# Patient Record
Sex: Male | Born: 1970 | Race: White | Hispanic: No | Marital: Married | Smoking: Former smoker
Health system: Southern US, Community
[De-identification: ages and names within clinical notes are randomized; demographics above are authoritative.]

## PROBLEM LIST (undated history)

## (undated) DIAGNOSIS — K219 Gastro-esophageal reflux disease without esophagitis: Secondary | ICD-10-CM

## (undated) DIAGNOSIS — Z973 Presence of spectacles and contact lenses: Secondary | ICD-10-CM

## (undated) DIAGNOSIS — Z87442 Personal history of urinary calculi: Secondary | ICD-10-CM

## (undated) DIAGNOSIS — Z8489 Family history of other specified conditions: Secondary | ICD-10-CM

## (undated) DIAGNOSIS — K8020 Calculus of gallbladder without cholecystitis without obstruction: Secondary | ICD-10-CM

## (undated) HISTORY — PX: TYMPANOSTOMY TUBE PLACEMENT: SHX32

## (undated) HISTORY — PX: MOUTH SURGERY: SHX715

## (undated) HISTORY — PX: OTHER SURGICAL HISTORY: SHX169

## (undated) HISTORY — PX: TONSILLECTOMY: SUR1361

---

## 2000-05-04 ENCOUNTER — Other Ambulatory Visit: Admission: RE | Admit: 2000-05-04 | Discharge: 2000-05-04 | Payer: Self-pay | Admitting: Family Medicine

## 2015-08-25 ENCOUNTER — Other Ambulatory Visit: Payer: Self-pay | Admitting: Gastroenterology

## 2015-08-25 DIAGNOSIS — R1013 Epigastric pain: Secondary | ICD-10-CM

## 2015-09-16 ENCOUNTER — Ambulatory Visit
Admission: RE | Admit: 2015-09-16 | Discharge: 2015-09-16 | Disposition: A | Discharge status: Home / Self Care | Payer: Self-pay | Source: Ambulatory Visit | Attending: Gastroenterology | Admitting: Gastroenterology

## 2015-09-16 DIAGNOSIS — R1013 Epigastric pain: Secondary | ICD-10-CM

## 2015-09-22 ENCOUNTER — Other Ambulatory Visit: Payer: Self-pay

## 2015-10-06 ENCOUNTER — Ambulatory Visit: Payer: Self-pay | Admitting: General Surgery

## 2015-10-06 NOTE — H&P (Signed)
Gene Alvarez 09/30/2015 9:49 AM Location: [facility] Patient #: [id number] DOB: October 01, 1970 Married / Language: English / Race: White Male   History of Present Illness Minerva Areola M. Toribio Seiber MD; 09/30/2015 11:06 AM) The patient is a 45 year old male who presents with symptomatic choledocholithiasis.  Additional reasons for visit:  Gallbladder disease is described as the following: He is referred by Dr. Dulce Sellar for ongoing issues with epigastric pain and gallstones. He states he has had issues for several years. Initially he had more GERD-like symptoms with burning in his chest generally postprandial. He states that he lost weight and did multiple different PPIs and got improvement. However he had return of his symptoms a while ago and followed up with Dr. Dulce Sellar. His symptoms at that time were still suggestive of gastritis or GERD. He underwent an upper endoscopy last week which was essentially unremarkable. He had some very mild gastritis. He describes his episodes is generally occurring at night they last 4-5 hours. The discomfort is in his upper central abdomen underneath his xiphoid. It generally occurs 30 minutes to an hour after eating. It does not radiate to his back. He does not have vomiting with that he may have some loading sensation or a full sensation. There is simply a correlation with fatty increased feeds. But he is now even having symptoms with a fat-free, high protein high vegetable diet. He denies any acholic stools. He denies any significant NSAID use until recently to manage the epigastric discomfort. He had an ultrasound which demonstrated cholelithiasis without any evidence of common bile duct stones   Problem List/Past Medical Atilano Ina, MD; 09/30/2015 11:06 AM) SYMPTOMATIC CHOLELITHIASIS (K80.20)  Other Problems Atilano Ina, MD; 09/30/2015 11:06 AM) Back Pain Cholelithiasis Gastroesophageal Reflux Disease Kidney Stone  Past Surgical History Fay Records, CMA; 09/30/2015 9:50 AM) Oral Surgery Tonsillectomy Vasectomy  Diagnostic Studies History Fay Records, CMA; 09/30/2015 9:50 AM) Colonoscopy never  Allergies Fay Records, CMA; 09/30/2015 9:50 AM) No Known Drug Allergies01/07/2016  Medication History Fay Records, CMA; 09/30/2015 9:50 AM) No Current Medications Medications Reconciled  Social History Fay Records, CMA; 09/30/2015 9:50 AM) Alcohol use Moderate alcohol use. Caffeine use Coffee, Tea. No drug use Tobacco use Former smoker.  Family History Fay Records, [place]; 09/30/2015 9:50 AM) Arthritis Mother. Depression Mother. Diabetes Mellitus Father. Heart disease in male family member before age 72 Melanoma Father. Thyroid problems Mother.    Review of Systems Fay Records CMA; 09/30/2015 9:50 AM) General Not Present- Appetite Loss, Chills, Fatigue, Fever, Night Sweats, Weight Gain and Weight Loss. Skin Not Present- Change in Wart/Mole, Dryness, Hives, Jaundice, New Lesions, Non-Healing Wounds, Rash and Ulcer. HEENT Not Present- Earache, Hearing Loss, Hoarseness, Nose Bleed, Oral Ulcers, Ringing in the Ears, Seasonal Allergies, Sinus Pain, Sore Throat, Visual Disturbances, Wears glasses/contact lenses and Yellow Eyes. Respiratory Not Present- Bloody sputum, Chronic Cough, Difficulty Breathing, Snoring and Wheezing. Breast Not Present- Breast Mass, Breast Pain, Nipple Discharge and Skin Changes. Cardiovascular Not Present- Chest Pain, Difficulty Breathing Lying Down, Leg Cramps, Palpitations, Rapid Heart Rate, Shortness of Breath and Swelling of Extremities. Gastrointestinal Present- Abdominal Pain. Not Present- Bloating, Bloody Stool, Change in Bowel Habits, Chronic diarrhea, Constipation, Difficulty Swallowing, Excessive gas, Gets full quickly at meals, Hemorrhoids, Indigestion, Nausea, Rectal Pain and Vomiting. Male Genitourinary Not Present- Blood in Urine, Change in Urinary Stream, Frequency, Impotence,  Nocturia, Painful Urination, Urgency and Urine Leakage. Musculoskeletal Present- Joint Pain. Not Present- Back Pain, Joint Stiffness, Muscle Pain, Muscle Weakness and  Swelling of Extremities. Neurological Not Present- Decreased Memory, Fainting, Headaches, Numbness, Seizures, Tingling, Tremor, Trouble walking and Weakness. Psychiatric Not Present- Anxiety, Bipolar, Change in Sleep Pattern, Depression, Fearful and Frequent crying. Endocrine Not Present- Cold Intolerance, Excessive Hunger, Hair Changes, Heat Intolerance, Hot flashes and New Diabetes. Hematology Not Present- Easy Bruising, Excessive bleeding, Gland problems, HIV and Persistent Infections.  Vitals Fay Records CMA; 09/30/2015 9:50 AM) 09/30/2015 9:50 AM Weight: 262.6 lb Height: 70in Body Surface Area: 2.34 m Body Mass Index: 37.68 kg/m  Temp.: 98.52F(Temporal)  Pulse: 86 (Regular)  BP: 132/68 (Sitting, Left Arm, Standard)       Physical Exam Minerva Areola M. Yago Ludvigsen MD; 09/30/2015 10:41 AM) General Mental Status-Alert. General Appearance-Consistent with stated age. Hydration-Well hydrated. Voice-Normal. Note: obese   Head and Neck Head-normocephalic, atraumatic with no lesions or palpable masses. Trachea-midline. Thyroid Gland Characteristics - normal size and consistency.  Eye Eyeball - Bilateral-Extraocular movements intact. Sclera/Conjunctiva - Bilateral-No scleral icterus.  Chest and Lung Exam Chest and lung exam reveals -quiet, even and easy respiratory effort with no use of accessory muscles and on auscultation, normal breath sounds, no adventitious sounds and normal vocal resonance. Inspection Chest Wall - Normal. Back - normal.  Breast - Did not examine.  Cardiovascular Cardiovascular examination reveals -normal heart sounds, regular rate and rhythm with no murmurs and normal pedal pulses bilaterally.  Abdomen Inspection Inspection of the abdomen reveals - No Hernias. Skin  - Scar - no surgical scars. Palpation/Percussion Palpation and Percussion of the abdomen reveal - Soft, Non Tender, No Rebound tenderness, No Rigidity (guarding) and No hepatosplenomegaly. Auscultation Auscultation of the abdomen reveals - Bowel sounds normal.  Peripheral Vascular Upper Extremity Palpation - Pulses bilaterally normal.  Neurologic Neurologic evaluation reveals -alert and oriented x 3 with no impairment of recent or remote memory. Mental Status-Normal.  Neuropsychiatric The patient's mood and affect are described as -normal. Judgment and Insight-insight is appropriate concerning matters relevant to self.  Musculoskeletal Normal Exam - Left-Upper Extremity Strength Normal and Lower Extremity Strength Normal. Normal Exam - Right-Upper Extremity Strength Normal and Lower Extremity Strength Normal.  Lymphatic Head & Neck  General Head & Neck Lymphatics: Bilateral - Description - Normal. Axillary - Did not examine. Femoral & Inguinal - Did not examine.    Assessment & Plan Minerva Areola M. Josuha Fontanez MD; 09/30/2015 10:41 AM) SYMPTOMATIC CHOLELITHIASIS (K80.20) Impression: I believe the patient's symptoms are consistent with gallbladder disease.  We discussed gallbladder disease. The patient was given Agricultural engineer. We discussed non-operative and operative management. We discussed the signs & symptoms of acute cholecystitis  I discussed laparoscopic cholecystectomy with IOC in detail. The patient was given educational material as well as diagrams detailing the procedure. We discussed the risks and benefits of a laparoscopic cholecystectomy including, but not limited to bleeding, infection, injury to surrounding structures such as the intestine or liver, bile leak, retained gallstones, need to convert to an open procedure, prolonged diarrhea, blood clots such as DVT, common bile duct injury, anesthesia risks, and possible need for additional procedures. We discussed  the typical post-operative recovery course. I explained that the likelihood of improvement of their symptoms is good.  The patient has elected to proceed with surgery. Current Plans You are being scheduled for surgery - Our schedulers will call you.  You should hear from our office's scheduling department within 5 working days about the location, date, and time of surgery. We try to make accommodations for patient's preferences in scheduling surgery, but sometimes the OR schedule or  the surgeon's schedule prevents [place] from making those accommodations.  If you have not heard from our office [phone]) in 5 working days, call the office and ask for your surgeon's nurse.  If you have other questions about your diagnosis, plan, or surgery, call the office and ask for your surgeon's nurse.  Pt Education - Pamphlet Given - Laparoscopic Gallbladder Surgery: discussed with patient and provided information. Pt Education - CCS Laparosopic Post Op HCI ([place]ross)  [name]. Andrey Campanile, MD, FACS General, Bariatric, & Minimally Invasive Surgery [facility], [place]

## 2015-10-28 ENCOUNTER — Encounter (HOSPITAL_COMMUNITY)
Admission: RE | Admit: 2015-10-28 | Discharge: 2015-10-28 | Disposition: A | Discharge status: Home / Self Care | Payer: BLUE CROSS/BLUE SHIELD | Source: Ambulatory Visit | Attending: General Surgery | Admitting: General Surgery

## 2015-10-28 ENCOUNTER — Encounter (HOSPITAL_COMMUNITY): Payer: Self-pay

## 2015-10-28 DIAGNOSIS — R1013 Epigastric pain: Secondary | ICD-10-CM | POA: Diagnosis present

## 2015-10-28 DIAGNOSIS — K219 Gastro-esophageal reflux disease without esophagitis: Secondary | ICD-10-CM | POA: Diagnosis not present

## 2015-10-28 DIAGNOSIS — Z87891 Personal history of nicotine dependence: Secondary | ICD-10-CM | POA: Diagnosis not present

## 2015-10-28 DIAGNOSIS — K8010 Calculus of gallbladder with chronic cholecystitis without obstruction: Secondary | ICD-10-CM | POA: Diagnosis not present

## 2015-10-28 DIAGNOSIS — Z79899 Other long term (current) drug therapy: Secondary | ICD-10-CM | POA: Diagnosis not present

## 2015-10-28 HISTORY — DX: Presence of spectacles and contact lenses: Z97.3

## 2015-10-28 HISTORY — DX: Gastro-esophageal reflux disease without esophagitis: K21.9

## 2015-10-28 HISTORY — DX: Personal history of urinary calculi: Z87.442

## 2015-10-28 HISTORY — DX: Calculus of gallbladder without cholecystitis without obstruction: K80.20

## 2015-10-28 HISTORY — DX: Family history of other specified conditions: Z84.89

## 2015-10-28 LAB — CBC WITH DIFFERENTIAL/PLATELET
BASOS PCT: 0 %
Basophils Absolute: 0 10*3/uL (ref 0.0–0.1)
Eosinophils Absolute: 0.1 10*3/uL (ref 0.0–0.7)
Eosinophils Relative: 2 %
HEMATOCRIT: 47.5 % (ref 39.0–52.0)
HEMOGLOBIN: 15.7 g/dL (ref 13.0–17.0)
Lymphocytes Relative: 27 %
Lymphs Abs: 1.4 10*3/uL (ref 0.7–4.0)
MCH: 30.4 pg (ref 26.0–34.0)
MCHC: 33.1 g/dL (ref 30.0–36.0)
MCV: 92.1 fL (ref 78.0–100.0)
MONOS PCT: 5 %
Monocytes Absolute: 0.3 10*3/uL (ref 0.1–1.0)
NEUTROS ABS: 3.5 10*3/uL (ref 1.7–7.7)
NEUTROS PCT: 66 %
Platelets: 306 10*3/uL (ref 150–400)
RBC: 5.16 MIL/uL (ref 4.22–5.81)
RDW: 12.6 % (ref 11.5–15.5)
WBC: 5.3 10*3/uL (ref 4.0–10.5)

## 2015-10-28 LAB — COMPREHENSIVE METABOLIC PANEL
ALBUMIN: 4.7 g/dL (ref 3.5–5.0)
ALK PHOS: 69 U/L (ref 38–126)
ALT: 58 U/L (ref 17–63)
ANION GAP: 8 (ref 5–15)
AST: 43 U/L — AB (ref 15–41)
BILIRUBIN TOTAL: 0.7 mg/dL (ref 0.3–1.2)
BUN: 17 mg/dL (ref 6–20)
CALCIUM: 9.5 mg/dL (ref 8.9–10.3)
CO2: 27 mmol/L (ref 22–32)
CREATININE: 0.87 mg/dL (ref 0.61–1.24)
Chloride: 106 mmol/L (ref 101–111)
GFR calc Af Amer: >60 mL/min (ref >60)
GFR calc non Af Amer: >60 mL/min (ref >60)
GLUCOSE: 88 mg/dL (ref 65–99)
Potassium: 4.7 mmol/L (ref 3.5–5.1)
Sodium: 141 mmol/L (ref 135–145)
TOTAL PROTEIN: 7.5 g/dL (ref 6.5–8.1)

## 2015-10-28 NOTE — Progress Notes (Signed)
Pt aware of avoiding ibuprofen and naproxen prior to surgery

## 2015-10-28 NOTE — Patient Instructions (Signed)
Gene Alvarez  10/28/2015   Your procedure is scheduled on: Friday October 30, 2015   Report to [facility] Main  Entrance take Gene Alvarez  elevators to 3rd floor to  Short Stay Center at 5:30 AM.  Call this number if you have problems the morning of surgery [phone]   Remember: ONLY 1 PERSON MAY GO WITH YOU TO SHORT STAY TO GET  READY MORNING OF YOUR SURGERY.  Do not eat food or drink liquids :After Midnight.     Take these medicines the morning of surgery with A SIP OF WATER: hydrocodone-acetaminophen if needed                                You may not have any metal on your body including hair pins and              piercings  Do not wear jewelry, lotions, powders or colognes, deodorant                        Men may shave face and neck.   Do not bring valuables to the hospital. [name] IS NOT             RESPONSIBLE   FOR VALUABLES.  Contacts, dentures or bridgework may not be worn into surgery.      Patients discharged the day of surgery will not be allowed to drive home.  Name and phone number of your driver:Gene Alvarez (wife)  _____________________________________________________________________             [facility] - Preparing for Surgery Before surgery, you can play an important role.  Because skin is not sterile, your skin needs to be as free of germs as possible.  You can reduce the number of germs on your skin by washing with CHG (chlorahexidine gluconate) soap before surgery.  CHG is an antiseptic cleaner which kills germs and bonds with the skin to continue killing germs even after washing. Please DO NOT use if you have an allergy to CHG or antibacterial soaps.  If your skin becomes reddened/irritated stop using the CHG and inform your nurse when you arrive at Short Stay. Do not shave (including legs and underarms) for at least 48 hours prior to the first CHG shower.  You may shave your face/neck. Please follow these instructions  carefully:  1.  Shower with CHG Soap the night before surgery and the  morning of Surgery.  2.  If you choose to wash your hair, wash your hair first as usual with your  normal  shampoo.  3.  After you shampoo, rinse your hair and body thoroughly to remove the  shampoo.                           4.  Use CHG as you would any other liquid soap.  You can apply chg directly  to the skin and wash                       Gently with a scrungie or clean washcloth.  5.  Apply the CHG Soap to your body ONLY FROM THE NECK DOWN.   Do not use on face/ open  Wound or open sores. Avoid contact with eyes, ears mouth and genitals (private parts).                       Wash face,  Genitals (private parts) with your normal soap.             6.  Wash thoroughly, paying special attention to the area where your surgery  will be performed.  7.  Thoroughly rinse your body with warm water from the neck down.  8.  DO NOT shower/wash with your normal soap after using and rinsing off  the CHG Soap.                9.  Pat yourself dry with a clean towel.            10.  Wear clean pajamas.            11.  Place clean sheets on your bed the night of your first shower and do not  sleep with pets. Day of Surgery : Do not apply any lotions/deodorants the morning of surgery.  Please wear clean clothes to the hospital/surgery center.  FAILURE TO FOLLOW THESE INSTRUCTIONS MAY RESULT IN THE CANCELLATION OF YOUR SURGERY PATIENT SIGNATURE_________________________________  NURSE SIGNATURE__________________________________  ________________________________________________________________________

## 2015-10-30 ENCOUNTER — Ambulatory Visit (HOSPITAL_COMMUNITY)
Admission: RE | Admit: 2015-10-30 | Discharge: 2015-10-30 | Disposition: A | Discharge status: Home / Self Care | Payer: BLUE CROSS/BLUE SHIELD | Source: Ambulatory Visit | Attending: General Surgery | Admitting: General Surgery

## 2015-10-30 ENCOUNTER — Encounter (HOSPITAL_COMMUNITY)
Admission: RE | Disposition: A | Discharge status: Home / Self Care | Payer: Self-pay | Source: Ambulatory Visit | Attending: General Surgery

## 2015-10-30 ENCOUNTER — Encounter (HOSPITAL_COMMUNITY): Payer: Self-pay | Admitting: *Deleted

## 2015-10-30 ENCOUNTER — Ambulatory Visit (HOSPITAL_COMMUNITY): Payer: BLUE CROSS/BLUE SHIELD

## 2015-10-30 ENCOUNTER — Ambulatory Visit (HOSPITAL_COMMUNITY): Payer: BLUE CROSS/BLUE SHIELD | Admitting: Certified Registered"

## 2015-10-30 DIAGNOSIS — Z87891 Personal history of nicotine dependence: Secondary | ICD-10-CM | POA: Insufficient documentation

## 2015-10-30 DIAGNOSIS — Z419 Encounter for procedure for purposes other than remedying health state, unspecified: Secondary | ICD-10-CM

## 2015-10-30 DIAGNOSIS — K219 Gastro-esophageal reflux disease without esophagitis: Secondary | ICD-10-CM | POA: Insufficient documentation

## 2015-10-30 DIAGNOSIS — K8010 Calculus of gallbladder with chronic cholecystitis without obstruction: Secondary | ICD-10-CM | POA: Insufficient documentation

## 2015-10-30 DIAGNOSIS — Z79899 Other long term (current) drug therapy: Secondary | ICD-10-CM | POA: Insufficient documentation

## 2015-10-30 HISTORY — PX: CHOLECYSTECTOMY: SHX55

## 2015-10-30 SURGERY — LAPAROSCOPIC CHOLECYSTECTOMY WITH INTRAOPERATIVE CHOLANGIOGRAM
Anesthesia: General

## 2015-10-30 MED ORDER — BUPIVACAINE-EPINEPHRINE (PF) 0.25% -1:200000 IJ SOLN
INTRAMUSCULAR | Status: AC
  Filled 2015-10-30: qty 30

## 2015-10-30 MED ORDER — HYDROMORPHONE HCL 1 MG/ML IJ SOLN
INTRAMUSCULAR | Status: AC
  Filled 2015-10-30: qty 1

## 2015-10-30 MED ORDER — LACTATED RINGERS IV SOLN
INTRAVENOUS | Status: DC | PRN
  Administered 2015-10-30 (×2): via INTRAVENOUS

## 2015-10-30 MED ORDER — DEXAMETHASONE SODIUM PHOSPHATE 10 MG/ML IJ SOLN
INTRAMUSCULAR | Status: AC
  Filled 2015-10-30: qty 1

## 2015-10-30 MED ORDER — CEFOTETAN DISODIUM-DEXTROSE 2-2.08 GM-% IV SOLR
INTRAVENOUS | Status: AC
  Filled 2015-10-30: qty 50

## 2015-10-30 MED ORDER — DEXAMETHASONE SODIUM PHOSPHATE 10 MG/ML IJ SOLN
INTRAMUSCULAR | Status: DC | PRN
  Administered 2015-10-30: 5 mg via INTRAVENOUS

## 2015-10-30 MED ORDER — FENTANYL CITRATE (PF) 100 MCG/2ML IJ SOLN: 25.0000 ug | INTRAMUSCULAR | Status: DC | PRN

## 2015-10-30 MED ORDER — SODIUM CHLORIDE 0.9% FLUSH: 3.0000 mL | INTRAVENOUS | Status: DC | PRN

## 2015-10-30 MED ORDER — FENTANYL CITRATE (PF) 250 MCG/5ML IJ SOLN
INTRAMUSCULAR | Status: AC
  Filled 2015-10-30: qty 5

## 2015-10-30 MED ORDER — FENTANYL CITRATE (PF) 100 MCG/2ML IJ SOLN
INTRAMUSCULAR | Status: AC
  Filled 2015-10-30: qty 2

## 2015-10-30 MED ORDER — CEFOTETAN DISODIUM-DEXTROSE 2-2.08 GM-% IV SOLR
2.0000 g | INTRAVENOUS | Status: AC
  Administered 2015-10-30: 2 g via INTRAVENOUS

## 2015-10-30 MED ORDER — CHLORHEXIDINE GLUCONATE 4 % EX LIQD
1.0000 | Freq: Once | CUTANEOUS | Status: DC
Start: 2015-10-30 — End: 2015-10-30

## 2015-10-30 MED ORDER — SODIUM CHLORIDE 0.9% FLUSH: 3.0000 mL | Freq: Two times a day (BID) | INTRAVENOUS | Status: DC

## 2015-10-30 MED ORDER — OXYCODONE HCL 5 MG PO TABS: 5.0000 mg | ORAL_TABLET | ORAL | Status: AC | PRN

## 2015-10-30 MED ORDER — ROCURONIUM BROMIDE 100 MG/10ML IV SOLN
INTRAVENOUS | Status: AC
  Filled 2015-10-30: qty 1

## 2015-10-30 MED ORDER — CHLORHEXIDINE GLUCONATE 4 % EX LIQD: 1.0000 "application " | Freq: Once | CUTANEOUS | Status: DC

## 2015-10-30 MED ORDER — BUPIVACAINE-EPINEPHRINE 0.25% -1:200000 IJ SOLN
INTRAMUSCULAR | Status: DC | PRN
  Administered 2015-10-30: 30 mL

## 2015-10-30 MED ORDER — IOHEXOL 300 MG/ML  SOLN
INTRAMUSCULAR | Status: DC | PRN
  Administered 2015-10-30: 50 mL via INTRAVENOUS

## 2015-10-30 MED ORDER — ONDANSETRON HCL 4 MG/2ML IJ SOLN
INTRAMUSCULAR | Status: DC | PRN
  Administered 2015-10-30 (×4): 2 mg via INTRAVENOUS

## 2015-10-30 MED ORDER — ACETAMINOPHEN 325 MG PO TABS: 650.0000 mg | ORAL_TABLET | ORAL | Status: DC | PRN

## 2015-10-30 MED ORDER — SODIUM CHLORIDE 0.9 % IV SOLN: INTRAVENOUS | Status: DC

## 2015-10-30 MED ORDER — ONDANSETRON HCL 4 MG/2ML IJ SOLN: 4.0000 mg | Freq: Once | INTRAMUSCULAR | Status: DC | PRN

## 2015-10-30 MED ORDER — PROPOFOL 10 MG/ML IV BOLUS
INTRAVENOUS | Status: DC | PRN
  Administered 2015-10-30: 200 mg via INTRAVENOUS
  Administered 2015-10-30 (×2): 50 mg via INTRAVENOUS

## 2015-10-30 MED ORDER — FENTANYL CITRATE (PF) 100 MCG/2ML IJ SOLN
INTRAMUSCULAR | Status: DC | PRN
  Administered 2015-10-30 (×5): 50 ug via INTRAVENOUS

## 2015-10-30 MED ORDER — SUGAMMADEX SODIUM 200 MG/2ML IV SOLN
INTRAVENOUS | Status: DC | PRN
  Administered 2015-10-30: 200 mg via INTRAVENOUS

## 2015-10-30 MED ORDER — ACETAMINOPHEN 500 MG PO TABS: 1000.0000 mg | ORAL_TABLET | Freq: Four times a day (QID) | ORAL | Status: DC

## 2015-10-30 MED ORDER — LIDOCAINE HCL (CARDIAC) 20 MG/ML IV SOLN
INTRAVENOUS | Status: DC | PRN
  Administered 2015-10-30: 100 mg via INTRAVENOUS

## 2015-10-30 MED ORDER — SUGAMMADEX SODIUM 200 MG/2ML IV SOLN
INTRAVENOUS | Status: AC
  Filled 2015-10-30: qty 2

## 2015-10-30 MED ORDER — PROPOFOL 10 MG/ML IV BOLUS
INTRAVENOUS | Status: AC
  Filled 2015-10-30: qty 20

## 2015-10-30 MED ORDER — ACETAMINOPHEN 650 MG RE SUPP: 650.0000 mg | RECTAL | Status: DC | PRN

## 2015-10-30 MED ORDER — OXYCODONE HCL 5 MG PO TABS
5.0000 mg | ORAL_TABLET | ORAL | Status: DC | PRN
  Administered 2015-10-30: 5 mg via ORAL
  Filled 2015-10-30: qty 1

## 2015-10-30 MED ORDER — MIDAZOLAM HCL 2 MG/2ML IJ SOLN
INTRAMUSCULAR | Status: AC
  Filled 2015-10-30: qty 2

## 2015-10-30 MED ORDER — SODIUM CHLORIDE 0.9 % IV SOLN: 250.0000 mL | INTRAVENOUS | Status: DC | PRN

## 2015-10-30 MED ORDER — ROCURONIUM BROMIDE 100 MG/10ML IV SOLN
INTRAVENOUS | Status: DC | PRN
  Administered 2015-10-30: 20 mg via INTRAVENOUS
  Administered 2015-10-30: 50 mg via INTRAVENOUS

## 2015-10-30 MED ORDER — LACTATED RINGERS IV SOLN: INTRAVENOUS | Status: DC

## 2015-10-30 MED ORDER — MIDAZOLAM HCL 5 MG/5ML IJ SOLN
INTRAMUSCULAR | Status: DC | PRN
  Administered 2015-10-30: 2 mg via INTRAVENOUS

## 2015-10-30 MED ORDER — LIDOCAINE HCL (CARDIAC) 20 MG/ML IV SOLN
INTRAVENOUS | Status: AC
  Filled 2015-10-30: qty 5

## 2015-10-30 MED ORDER — LACTATED RINGERS IV SOLN
INTRAVENOUS | Status: DC | PRN
  Administered 2015-10-30: 1000 mL via INTRAVENOUS

## 2015-10-30 MED ORDER — HYDROMORPHONE HCL 1 MG/ML IJ SOLN
0.2500 mg | INTRAMUSCULAR | Status: DC | PRN
  Administered 2015-10-30 (×4): 0.5 mg via INTRAVENOUS

## 2015-10-30 MED ORDER — MEPERIDINE HCL 50 MG/ML IJ SOLN: 6.2500 mg | INTRAMUSCULAR | Status: DC | PRN

## 2015-10-30 MED ORDER — ONDANSETRON HCL 4 MG/2ML IJ SOLN
INTRAMUSCULAR | Status: AC
  Filled 2015-10-30: qty 4

## 2015-10-30 SURGICAL SUPPLY — 48 items
ADH SKN CLS APL DERMABOND .7 (GAUZE/BANDAGES/DRESSINGS) ×2
APL SRG 38 LTWT LNG FL B (MISCELLANEOUS) ×1
APPLICATOR ARISTA FLEXITIP XL (MISCELLANEOUS) ×2 IMPLANT
APPLIER CLIP 5 13 M/L LIGAMAX5 (MISCELLANEOUS) ×2
APPLIER CLIP ROT 10 11.4 M/L (STAPLE)
APR CLP MED LRG 11.4X10 (STAPLE)
APR CLP MED LRG 5 ANG JAW (MISCELLANEOUS) ×1
BAG SPEC RTRVL 10 TROC 200 (ENDOMECHANICALS) ×1
CABLE HIGH FREQUENCY MONO STRZ (ELECTRODE) ×2 IMPLANT
CHLORAPREP W/TINT 26ML (MISCELLANEOUS) ×2 IMPLANT
CLIP APPLIE 5 13 M/L LIGAMAX5 (MISCELLANEOUS) ×1 IMPLANT
CLIP APPLIE ROT 10 11.4 M/L (STAPLE) IMPLANT
COVER MAYO STAND STRL (DRAPES) ×2 IMPLANT
COVER SURGICAL LIGHT HANDLE (MISCELLANEOUS) ×2 IMPLANT
DECANTER SPIKE VIAL GLASS SM (MISCELLANEOUS) ×2 IMPLANT
DERMABOND ADVANCED (GAUZE/BANDAGES/DRESSINGS) ×2
DERMABOND ADVANCED .7 DNX12 (GAUZE/BANDAGES/DRESSINGS) ×2 IMPLANT
DEVICE TROCAR PUNCTURE CLOSURE (ENDOMECHANICALS) ×2 IMPLANT
DRAPE C-ARM 42X120 X-RAY (DRAPES) ×2 IMPLANT
DRAPE LAPAROSCOPIC ABDOMINAL (DRAPES) ×2 IMPLANT
ELECT PENCIL ROCKER SW 15FT (MISCELLANEOUS) ×2 IMPLANT
ELECT REM PT RETURN 9FT ADLT (ELECTROSURGICAL) ×2
ELECTRODE REM PT RTRN 9FT ADLT (ELECTROSURGICAL) ×1 IMPLANT
ENDOLOOP SUT PDS II  0 18 (SUTURE) ×1
ENDOLOOP SUT PDS II 0 18 (SUTURE) ×1 IMPLANT
GLOVE BIOGEL M STRL SZ7.5 (GLOVE) ×2 IMPLANT
GOWN STRL REUS W/TWL XL LVL3 (GOWN DISPOSABLE) ×6 IMPLANT
HEMOSTAT ARISTA ABSORB 3G PWDR (MISCELLANEOUS) ×2 IMPLANT
HEMOSTAT SNOW SURGICEL 2X4 (HEMOSTASIS) IMPLANT
KIT BASIN OR (CUSTOM PROCEDURE TRAY) ×2 IMPLANT
L-HOOK LAP DISP 36CM (ELECTROSURGICAL) ×2
LHOOK LAP DISP 36CM (ELECTROSURGICAL) ×1 IMPLANT
LIQUID BAND (GAUZE/BANDAGES/DRESSINGS) ×2 IMPLANT
POUCH RETRIEVAL ECOSAC 10 (ENDOMECHANICALS) ×1 IMPLANT
POUCH RETRIEVAL ECOSAC 10MM (ENDOMECHANICALS) ×1
SCISSORS LAP 5X35 DISP (ENDOMECHANICALS) ×2 IMPLANT
SET CHOLANGIOGRAPH MIX (MISCELLANEOUS) ×2 IMPLANT
SET IRRIG TUBING LAPAROSCOPIC (IRRIGATION / IRRIGATOR) ×2 IMPLANT
SLEEVE SURGEON STRL (DRAPES) ×2 IMPLANT
SLEEVE XCEL OPT CAN 5 100 (ENDOMECHANICALS) ×4 IMPLANT
SUT MNCRL AB 4-0 PS2 18 (SUTURE) ×4 IMPLANT
SUT VICRYL 0 UR6 27IN ABS (SUTURE) ×2 IMPLANT
TOWEL OR 17X26 10 PK STRL BLUE (TOWEL DISPOSABLE) ×2 IMPLANT
TOWEL OR NON WOVEN STRL DISP B (DISPOSABLE) ×2 IMPLANT
TRAY LAPAROSCOPIC (CUSTOM PROCEDURE TRAY) ×2 IMPLANT
TROCAR BLADELESS OPT 5 100 (ENDOMECHANICALS) ×2 IMPLANT
TROCAR XCEL BLUNT TIP 100MML (ENDOMECHANICALS) ×2 IMPLANT
TROCAR XCEL NON-BLD 11X100MML (ENDOMECHANICALS) IMPLANT

## 2015-10-30 NOTE — H&P (Signed)
Gene Alvarez 09/30/2015 9:49 AM Location: [facility] Patient #: [id number] DOB: 06/02/1971 Married / Language: English / Race: White Male   History of Present Illness (Roise Emert M. Dinesha Twiggs MD; 09/30/2015 11:06 AM) The patient is a 45 year old male who presents with symptomatic choledocholithiasis.  Additional reasons for visit:  Gallbladder disease is described as the following: He is referred by Dr. outlaw for ongoing issues with epigastric pain and gallstones. He states he has had issues for several years. Initially he had more GERD-like symptoms with burning in his chest generally postprandial. He states that he lost weight and did multiple different PPIs and got improvement. However he had return of his symptoms a while ago and followed up with Dr. outlaw. His symptoms at that time were still suggestive of gastritis or GERD. He underwent an upper endoscopy last week which was essentially unremarkable. He had some very mild gastritis. He describes his episodes is generally occurring at night they last 4-5 hours. The discomfort is in his upper central abdomen underneath his xiphoid. It generally occurs 30 minutes to an hour after eating. It does not radiate to his back. He does not have vomiting with that he may have some loading sensation or a full sensation. There is simply a correlation with fatty increased feeds. But he is now even having symptoms with a fat-free, high protein high vegetable diet. He denies any acholic stools. He denies any significant NSAID use until recently to manage the epigastric discomfort. He had an ultrasound which demonstrated cholelithiasis without any evidence of common bile duct stones   Problem List/Past Medical (Sheena Donegan M Kaelei Wheeler, MD; 09/30/2015 11:06 AM) SYMPTOMATIC CHOLELITHIASIS (K80.20)  Other Problems (Jhordan Kinter M Thomas Rhude, MD; 09/30/2015 11:06 AM) Back Pain Cholelithiasis Gastroesophageal Reflux Disease Kidney Stone  Past Surgical History (Ashley  Beck, CMA; 09/30/2015 9:50 AM) Oral Surgery Tonsillectomy Vasectomy  Diagnostic Studies History (Ashley Beck, CMA; 09/30/2015 9:50 AM) Colonoscopy never  Allergies (Ashley Beck, CMA; 09/30/2015 9:50 AM) No Known Drug Allergies01/07/2016  Medication History (Ashley Beck, CMA; 09/30/2015 9:50 AM) No Current Medications Medications Reconciled  Social History (Ashley Beck, CMA; 09/30/2015 9:50 AM) Alcohol use Moderate alcohol use. Caffeine use Coffee, Tea. No drug use Tobacco use Former smoker.  Family History (Ashley Beck, CMA; 09/30/2015 9:50 AM) Arthritis Mother. Depression Mother. Diabetes Mellitus Father. Heart disease in male family member before age 65 Melanoma Father. Thyroid problems Mother.    Review of Systems (Ashley Beck CMA; 09/30/2015 9:50 AM) General Not Present- Appetite Loss, Chills, Fatigue, Fever, Night Sweats, Weight Gain and Weight Loss. Skin Not Present- Change in Wart/Mole, Dryness, Hives, Jaundice, New Lesions, Non-Healing Wounds, Rash and Ulcer. HEENT Not Present- Earache, Hearing Loss, Hoarseness, Nose Bleed, Oral Ulcers, Ringing in the Ears, Seasonal Allergies, Sinus Pain, Sore Throat, Visual Disturbances, Wears glasses/contact lenses and Yellow Eyes. Respiratory Not Present- Bloody sputum, Chronic Cough, Difficulty Breathing, Snoring and Wheezing. Breast Not Present- Breast Mass, Breast Pain, Nipple Discharge and Skin Changes. Cardiovascular Not Present- Chest Pain, Difficulty Breathing Lying Down, Leg Cramps, Palpitations, Rapid Heart Rate, Shortness of Breath and Swelling of Extremities. Gastrointestinal Present- Abdominal Pain. Not Present- Bloating, Bloody Stool, Change in Bowel Habits, Chronic diarrhea, Constipation, Difficulty Swallowing, Excessive gas, Gets full quickly at meals, Hemorrhoids, Indigestion, Nausea, Rectal Pain and Vomiting. Male Genitourinary Not Present- Blood in Urine, Change in Urinary Stream, Frequency, Impotence,  Nocturia, Painful Urination, Urgency and Urine Leakage. Musculoskeletal Present- Joint Pain. Not Present- Back Pain, Joint Stiffness, Muscle Pain, Muscle Weakness and   Swelling of Extremities. Neurological Not Present- Decreased Memory, Fainting, Headaches, Numbness, Seizures, Tingling, Tremor, Trouble walking and Weakness. Psychiatric Not Present- Anxiety, Bipolar, Change in Sleep Pattern, Depression, Fearful and Frequent crying. Endocrine Not Present- Cold Intolerance, Excessive Hunger, Hair Changes, Heat Intolerance, Hot flashes and New Diabetes. Hematology Not Present- Easy Bruising, Excessive bleeding, Gland problems, HIV and Persistent Infections.  Vitals (Ashley Beck CMA; 09/30/2015 9:50 AM) 09/30/2015 9:50 AM Weight: 262.6 lb Height: 70in Body Surface Area: 2.34 m Body Mass Index: 37.68 kg/m  Temp.: 98.1F(Temporal)  Pulse: 86 (Regular)  BP: 132/68 (Sitting, Left Arm, Standard)       Physical Exam (Aerika Groll M. Bron Snellings MD; 09/30/2015 10:41 AM) General Mental Status-Alert. General Appearance-Consistent with stated age. Hydration-Well hydrated. Voice-Normal. Note: obese   Head and Neck Head-normocephalic, atraumatic with no lesions or palpable masses. Trachea-midline. Thyroid Gland Characteristics - normal size and consistency.  Eye Eyeball - Bilateral-Extraocular movements intact. Sclera/Conjunctiva - Bilateral-No scleral icterus.  Chest and Lung Exam Chest and lung exam reveals -quiet, even and easy respiratory effort with no use of accessory muscles and on auscultation, normal breath sounds, no adventitious sounds and normal vocal resonance. Inspection Chest Wall - Normal. Back - normal.  Breast - Did not examine.  Cardiovascular Cardiovascular examination reveals -normal heart sounds, regular rate and rhythm with no murmurs and normal pedal pulses bilaterally.  Abdomen Inspection Inspection of the abdomen reveals - No Hernias. Skin  - Scar - no surgical scars. Palpation/Percussion Palpation and Percussion of the abdomen reveal - Soft, Non Tender, No Rebound tenderness, No Rigidity (guarding) and No hepatosplenomegaly. Auscultation Auscultation of the abdomen reveals - Bowel sounds normal.  Peripheral Vascular Upper Extremity Palpation - Pulses bilaterally normal.  Neurologic Neurologic evaluation reveals -alert and oriented x 3 with no impairment of recent or remote memory. Mental Status-Normal.  Neuropsychiatric The patient's mood and affect are described as -normal. Judgment and Insight-insight is appropriate concerning matters relevant to self.  Musculoskeletal Normal Exam - Left-Upper Extremity Strength Normal and Lower Extremity Strength Normal. Normal Exam - Right-Upper Extremity Strength Normal and Lower Extremity Strength Normal.  Lymphatic Head & Neck  General Head & Neck Lymphatics: Bilateral - Description - Normal. Axillary - Did not examine. Femoral & Inguinal - Did not examine.    Assessment & Plan (Lexys Milliner M. Hendryx Ricke MD; 09/30/2015 10:41 AM) SYMPTOMATIC CHOLELITHIASIS (K80.20) Impression: I believe the patient's symptoms are consistent with gallbladder disease.  We discussed gallbladder disease. The patient was given educational material. We discussed non-operative and operative management. We discussed the signs & symptoms of acute cholecystitis  I discussed laparoscopic cholecystectomy with IOC in detail. The patient was given educational material as well as diagrams detailing the procedure. We discussed the risks and benefits of a laparoscopic cholecystectomy including, but not limited to bleeding, infection, injury to surrounding structures such as the intestine or liver, bile leak, retained gallstones, need to convert to an open procedure, prolonged diarrhea, blood clots such as DVT, common bile duct injury, anesthesia risks, and possible need for additional procedures. We discussed  the typical post-operative recovery course. I explained that the likelihood of improvement of their symptoms is good.  The patient has elected to proceed with surgery. Current Plans You are being scheduled for surgery - Our schedulers will call you.  You should hear from our office's scheduling department within 5 working days about the location, date, and time of surgery. We try to make accommodations for patient's preferences in scheduling surgery, but sometimes the OR schedule or   the surgeon's schedule prevents us from making those accommodations.  If you have not heard from our office ([phone]) in 5 working days, call the office and ask for your surgeon's nurse.  If you have other questions about your diagnosis, plan, or surgery, call the office and ask for your surgeon's nurse.  Pt Education - Pamphlet Given - Laparoscopic Gallbladder Surgery: discussed with patient and provided information. Pt Education - CCS Laparosopic Post Op HCI ([place])  Gena Laski M. Brittlyn Cloe, MD, FACS General, Bariatric, & Minimally Invasive Surgery [facility] [place]  

## 2015-10-30 NOTE — Discharge Instructions (Signed)
[facility] °LAPAROSCOPIC SURGERY: POST OP INSTRUCTIONS °Always review your discharge instruction sheet given to you by the facility where your surgery was performed. °IF YOU HAVE DISABILITY OR FAMILY LEAVE FORMS, YOU MUST BRING THEM TO THE OFFICE FOR PROCESSING.   °DO NOT GIVE THEM TO YOUR DOCTOR. ° °1. A prescription for pain medication may be given to you upon discharge.  Take your pain medication as prescribed, if needed.  If narcotic pain medicine is not needed, then you may take acetaminophen (Tylenol) or ibuprofen (Advil) as needed. °2. Take your usually prescribed medications unless otherwise directed. °3. If you need a refill on your pain medication, please contact your pharmacy.  They will contact our office to request authorization. Prescriptions will not be filled after 5pm or on week-ends. °4. You should follow a light diet the first few days after arrival home, such as soup and crackers, etc.  Be sure to include lots of fluids daily. °5. Most patients will experience some swelling and bruising in the area of the incisions.  Ice packs will help.  Swelling and bruising can take several days to resolve.  °6. It is common to experience some constipation if taking pain medication after surgery.  Increasing fluid intake and taking a stool softener (such as Colace) will usually help or prevent this problem from occurring.  A mild laxative (Milk of Magnesia or Miralax) should be taken according to package instructions if there are no bowel movements after 48 hours. °7.  If your surgeon used skin glue on the incision, you may shower in 24 hours.  The glue will Ayad off over the next 2-3 weeks.  Any sutures or staples will be removed at the office during your follow-up visit. °8. ACTIVITIES:  You may resume regular (light) daily activities beginning the next day--such as daily self-care, walking, climbing stairs--gradually increasing activities as tolerated.  You may have sexual  intercourse when it is comfortable.  Refrain from any heavy lifting or straining until approved by your doctor. °a. You may drive when you are no longer taking prescription pain medication, you can comfortably wear a seatbelt, and you can safely maneuver your car and apply brakes. °9. You should see your doctor in the office for a follow-up appointment approximately 2-3 weeks after your surgery.  Make sure that you call for this appointment within a day or two after you arrive home to insure a convenient appointment time. °10. OTHER INSTRUCTIONS:DO NOT LIFT, PUSH, OR PULL ANYTHING GREATER THAN 10 POUNDS FOR 2 WEEKS  °WHEN TO CALL YOUR DOCTOR: °1. Fever over 101.0 °2. Inability to urinate °3. Continued bleeding from incision. °4. Increased pain, redness, or drainage from the incision. °5. Increasing abdominal pain ° °The clinic staff is available to answer your questions during regular business hours.  Please don’t hesitate to call and ask to speak to one of the nurses for clinical concerns.  If you have a medical emergency, go to the nearest emergency room or call 911.  A surgeon from [facility] is always on call at the hospital. °[address], [address], [place], [place]  [address] ? [address] [address], [place], [place]   [address] °[phone] ? [phone] ? FAX [phone] °Web site: [email or web address] ° °

## 2015-10-30 NOTE — Anesthesia Postprocedure Evaluation (Signed)
Anesthesia Post Note  Patient: Gene Alvarez  Procedure(s) Performed: Procedure(s) (LRB): LAPAROSCOPIC CHOLECYSTECTOMY WITH INTRAOPERATIVE CHOLANGIOGRAM (N/A)  Patient location during evaluation: PACU Anesthesia Type: General Level of consciousness: awake and alert Pain management: pain level controlled Vital Signs Assessment: post-procedure vital signs reviewed and stable Respiratory status: spontaneous breathing, nonlabored ventilation, respiratory function stable and patient connected to nasal cannula oxygen Cardiovascular status: blood pressure returned to baseline and stable Postop Assessment: no signs of nausea or vomiting Anesthetic complications: no    Last Vitals:  Filed Vitals:   10/30/15 0945 10/30/15 1007  BP: 143/87 112/76  Pulse: 83 79  Temp: 36.4 C 36.4 C  Resp: 14 16    Last Pain:  Filed Vitals:   10/30/15 1015  PainSc: 4                  Sibel Khurana DAVID

## 2015-10-30 NOTE — Interval H&P Note (Signed)
History and Physical Interval Note:  10/30/2015 7:19 AM  Gene Alvarez  has presented today for surgery, with the diagnosis of symptomatic cholelithiasis  The various methods of treatment have been discussed with the patient and family. After consideration of risks, benefits and other options for treatment, the patient has consented to  Procedure(s): LAPAROSCOPIC CHOLECYSTECTOMY WITH INTRAOPERATIVE CHOLANGIOGRAM (N/A) as a surgical intervention .  The patient's history has been reviewed, patient examined, no change in status, stable for surgery.  I have reviewed the patient's chart and labs.  Questions were answered to the patient's satisfaction.    [name]. Andrey Campanile, MD, FACS General, Bariatric, & Minimally Invasive Surgery [facility], [place]   [facility] M

## 2015-10-30 NOTE — Op Note (Signed)
Gene Alvarez [id number] 09/22/1970 10/30/2015  Laparoscopic Cholecystectomy with IOC Procedure Note  Indications: This patient presents with symptomatic gallbladder disease and will undergo laparoscopic cholecystectomy.  Pre-operative Diagnosis: symptomatic cholelithiasis  Post-operative Diagnosis: chronic calculous cholecystitis  Surgeon: Atilano Ina   Assistants: Mliss Sax, RNFA  Anesthesia: General endotracheal anesthesia  Procedure Details  The patient was seen again in the Holding Room. The risks, benefits, complications, treatment options, and expected outcomes were discussed with the patient. The possibilities of reaction to medication, pulmonary aspiration, perforation of viscus, bleeding, recurrent infection, finding a normal gallbladder, the need for additional procedures, failure to diagnose a condition, the possible need to convert to an open procedure, and creating a complication requiring transfusion or operation were discussed with the patient. The likelihood of improving the patient's symptoms with return to their baseline status is good.  The patient and/or family concurred with the proposed plan, giving informed consent. The site of surgery properly noted. The patient was taken to Operating Room, identified as Gene Alvarez and the procedure verified as Laparoscopic Cholecystectomy with Intraoperative Cholangiogram. A Time Out was held and the above information confirmed. Antibiotic prophylaxis was administered.   Prior to the induction of general anesthesia, antibiotic prophylaxis was administered. General endotracheal anesthesia was then administered and tolerated well. After the induction, the abdomen was prepped with Chloraprep and draped in the sterile fashion. The patient was positioned in the supine position.  Local anesthetic agent was injected into the skin near the umbilicus and an incision made. We dissected down to the abdominal fascia with blunt  dissection.  The fascia was incised vertically and we entered the peritoneal cavity bluntly.  A pursestring suture of 0-Vicryl was placed around the fascial opening.  The Hasson cannula was inserted and secured with the stay suture.  Pneumoperitoneum was then created with CO2 and tolerated well without any adverse changes in the patient's vital signs. An 5-mm port was placed in the subxiphoid position.  Two 5-mm ports were placed in the right upper quadrant. All skin incisions were infiltrated with a local anesthetic agent before making the incision and placing the trocars.   We positioned the patient in reverse Trendelenburg, tilted slightly to the patient's left.  The gallbladder was identified, the fundus grasped and retracted cephalad. Adhesions were lysed bluntly and with the electrocautery where indicated, taking care not to injure any adjacent organs or viscus. The gallbladder was slightly intrahepatic and full of several large stones. The infundibulum was grasped and retracted laterally, exposing the peritoneum overlying the triangle of Calot. This was then divided and exposed in a blunt fashion. A critical view of the cystic duct and cystic artery was obtained.  The cystic duct was clearly identified and bluntly dissected circumferentially. The cystic duct was ligated with a clip distally.   An incision was made in the cystic duct and the [facility] cholangiogram catheter introduced. The catheter was secured using a clip. A cholangiogram was then obtained which showed good visualization of the distal and proximal biliary tree with no sign of filling defects or obstruction.  Contrast flowed easily into the duodenum. The catheter was then removed.   The cystic duct was then ligated with clips and divided. The cystic artery which had been identified & dissected free was ligated with clips and divided as well.   The gallbladder was dissected from the liver bed in retrograde fashion with the electrocautery. I  did place an endoloop around the infundibulum because it started to leak a  little bile from around the clip. The gallbladder was removed and placed in an Ecco sac.  The gallbladder and Ecco sac were then removed through the umbilical port site after enlarging the umbilical fascial defect to accommodate how large the gallbladder/stones were. I also had to enlarge the skin incision as well. The liver bed was irrigated and inspected. Hemostasis was achieved with the electrocautery but it took several rounds of electrocautery. Copious irrigation was utilized and was repeatedly aspirated until clear. I did elect to place Arista powder into the gallbladder since it had been oozy. The pursestring suture was used to close the umbilical fascia.    We again inspected the right upper quadrant for hemostasis.  The umbilical closure was inspected and there was an air leak. 2 additional interrupted 0 vicryl sutures were placed at the umbilical fascia using an endoclose with laparoscopic guidance. There was no air leak or palpable defect and nothing trapped within the closure. Pneumoperitoneum was released as we removed the trocars.  4-0 Monocryl was used to close the skin.   Dermabond was applied. The patient was then extubated and brought to the recovery room in stable condition. Instrument, sponge, and needle counts were correct at closure and at the conclusion of the case.   Findings: Chronic Cholecystitis with Cholelithiasis  Estimated Blood Loss: Minimal         Drains: none         Specimens: Gallbladder           Complications: None; patient tolerated the procedure well.         Disposition: PACU - hemodynamically stable.         Condition: stable  [name]. Andrey Campanile, MD, FACS General, Bariatric, & Minimally Invasive Surgery [facility], [place]

## 2015-10-30 NOTE — Anesthesia Preprocedure Evaluation (Signed)
Anesthesia Evaluation  Patient identified by MRN, date of birth, ID band Patient awake    Reviewed: Allergy & Precautions, NPO status , Patient's Chart, lab work & pertinent test results  Airway Mallampati: I  TM Distance: >3 FB Neck ROM: Full    Dental   Pulmonary former smoker,    Pulmonary exam normal        Cardiovascular Normal cardiovascular exam     Neuro/Psych    GI/Hepatic GERD  Medicated and Controlled,  Endo/Other    Renal/GU      Musculoskeletal   Abdominal   Peds  Hematology   Anesthesia Other Findings   Reproductive/Obstetrics                             Anesthesia Physical Anesthesia Plan  ASA: II  Anesthesia Plan: General   Post-op Pain Management:    Induction: Intravenous  Airway Management Planned: Oral ETT  Additional Equipment:   Intra-op Plan:   Post-operative Plan: Extubation in OR  Informed Consent: I have reviewed the patients History and Physical, chart, labs and discussed the procedure including the risks, benefits and alternatives for the proposed anesthesia with the patient or authorized representative who has indicated his/her understanding and acceptance.     Plan Discussed with: CRNA and Surgeon  Anesthesia Plan Comments:         Anesthesia Quick Evaluation  

## 2015-10-30 NOTE — Transfer of Care (Signed)
Immediate Anesthesia Transfer of Care Note  Patient: Gene Alvarez  Procedure(s) Performed: Procedure(s): LAPAROSCOPIC CHOLECYSTECTOMY WITH INTRAOPERATIVE CHOLANGIOGRAM (N/A)  Patient Location: PACU  Anesthesia Type:General  Level of Consciousness: awake, sedated and responds to stimulation  Airway & Oxygen Therapy: Patient Spontanous Breathing and Patient connected to face mask oxygen  Post-op Assessment: Report given to RN and Post -op Vital signs reviewed and stable  Post vital signs: Reviewed and stable  Last Vitals:  Filed Vitals:   10/30/15 0542  BP: 128/66  Pulse: 81  Temp: 36.8 C  Resp: 18    Complications: No apparent anesthesia complications

## 2015-10-30 NOTE — Anesthesia Procedure Notes (Signed)
Procedure Name: Intubation Date/Time: 10/30/2015 8:09 AM Performed by: Army Fossa Pre-anesthesia Checklist: Patient identified, Emergency Drugs available, Suction available, Patient being monitored and Timeout performed Patient Re-evaluated:Patient Re-evaluated prior to inductionOxygen Delivery Method: Circle system utilized Preoxygenation: Pre-oxygenation with 100% oxygen Intubation Type: IV induction Ventilation: Oral airway inserted - appropriate to patient size Laryngoscope Size: Mac and 4 Grade View: Grade II Tube type: Oral Tube size: 7.0 mm Number of attempts: 1 Airway Equipment and Method: Patient positioned with wedge pillow and Stylet Placement Confirmation: ETT inserted through vocal cords under direct vision,  positive ETCO2,  CO2 detector and breath sounds checked- equal and bilateral Secured at: 23 cm Tube secured with: Tape Dental Injury: Teeth and Oropharynx as per pre-operative assessment

## 2016-07-11 DIAGNOSIS — R05 Cough: Secondary | ICD-10-CM | POA: Diagnosis not present

## 2016-07-11 DIAGNOSIS — B9789 Other viral agents as the cause of diseases classified elsewhere: Secondary | ICD-10-CM | POA: Diagnosis not present

## 2016-07-11 DIAGNOSIS — Z23 Encounter for immunization: Secondary | ICD-10-CM | POA: Diagnosis not present

## 2016-09-26 IMAGING — RF DG CHOLANGIOGRAM OPERATIVE
1 series · 4 of 4 positions shown · non-contrast
Comparison: Abdominal ultrasound dated 09/16/2015

CLINICAL DATA: Cholecystectomy for symptomatic cholelithiasis.

EXAM:
INTRAOPERATIVE CHOLANGIOGRAM
TECHNIQUE: Cholangiographic images from the C-arm fluoroscopic device were
submitted for interpretation post-operatively. Please see the
procedural report for the amount of contrast and the fluoroscopy
time utilized.

[Series 1: run · 4 of 33 frames shown]
[frame 5/33]
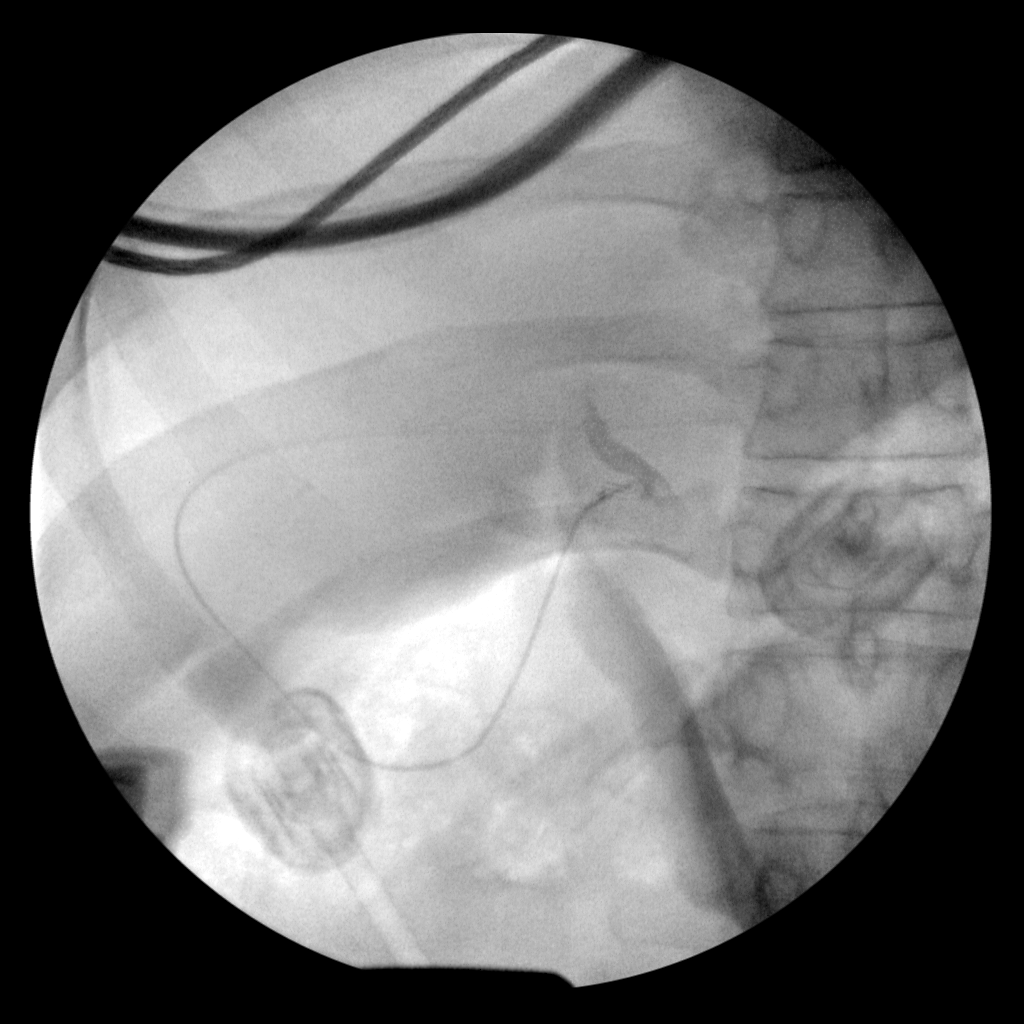
[frame 7/33]
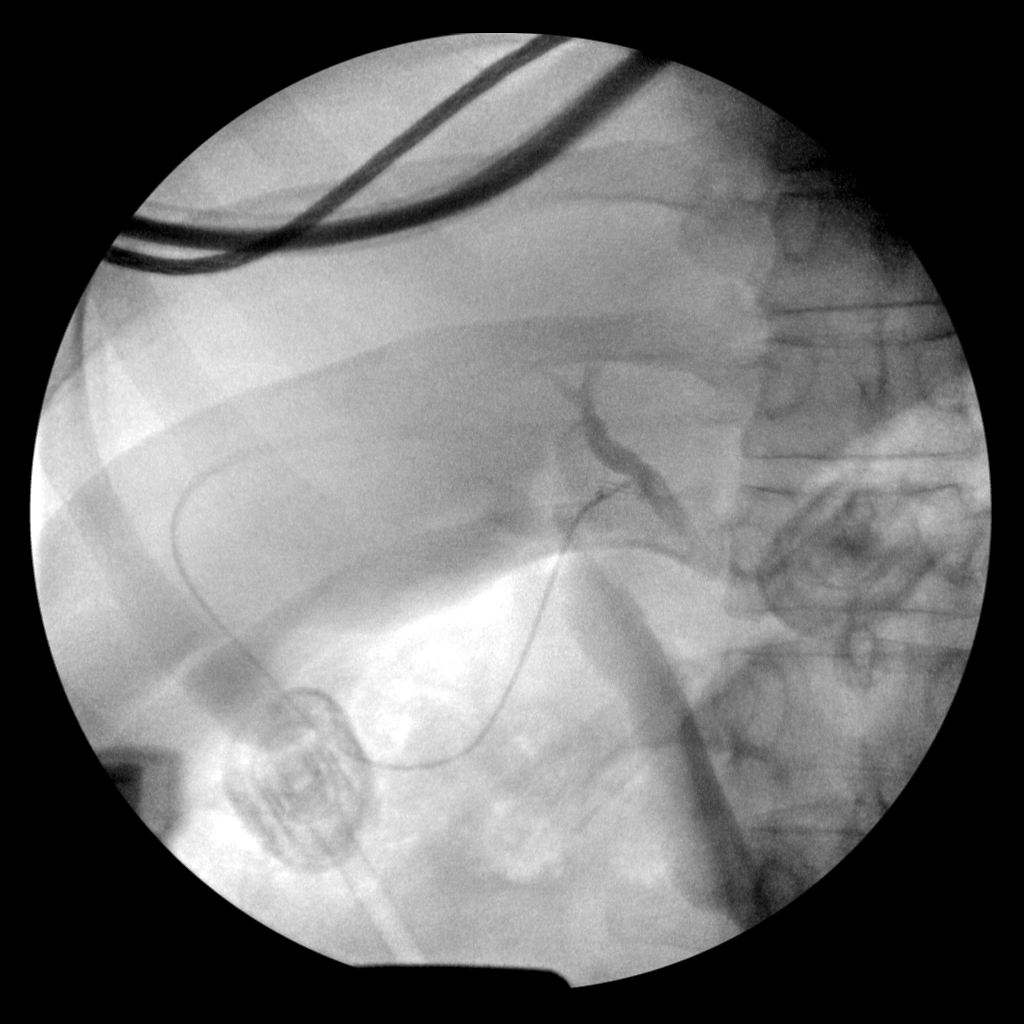
[frame 17/33]
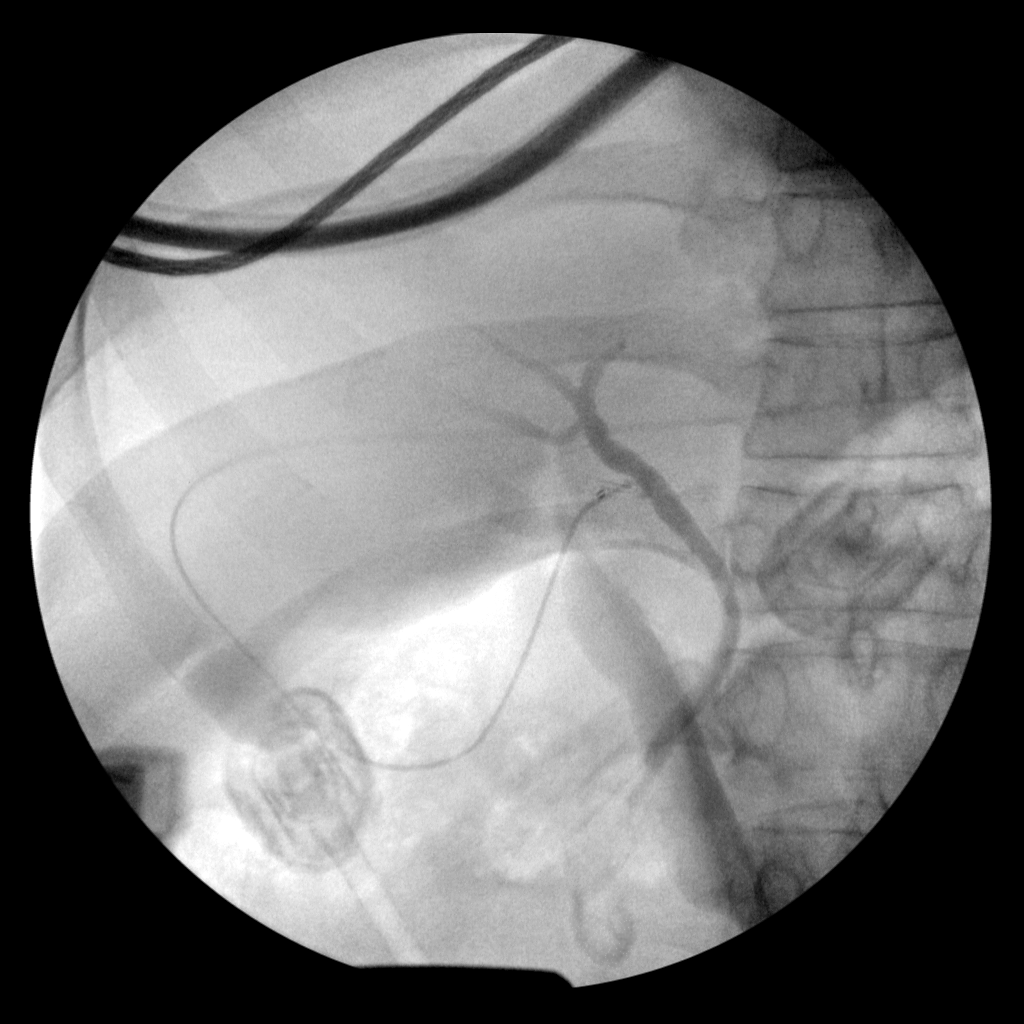
[frame 29/33]
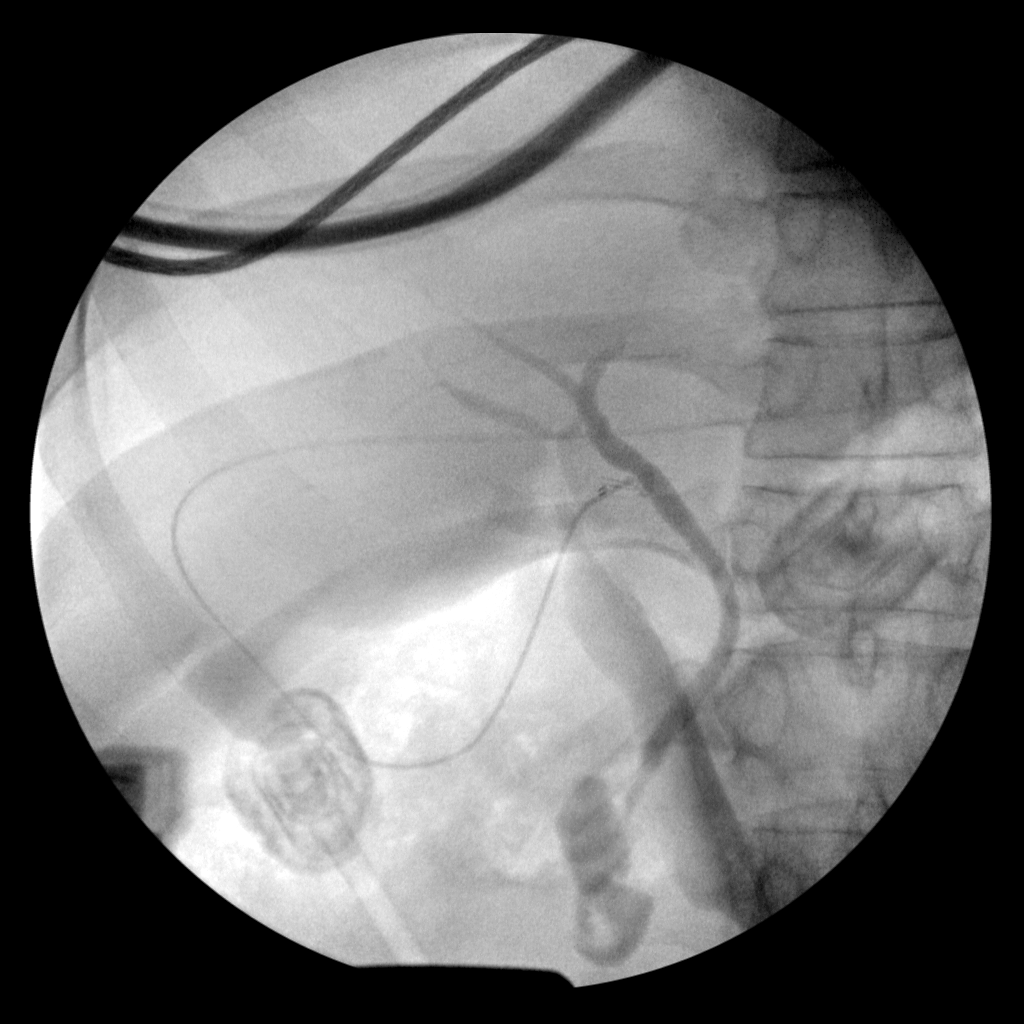

[4 of 4 positions shown; findings below may reference images not displayed]

FINDINGS: Intraoperative imaging with a C-arm demonstrates a normal opacified
biliary tree without evidence of biliary dilatation, filling defect
or contrast extravasation. Contrast enters the duodenum normally.
IMPRESSION: Normal intraoperative cholangiogram.

## 2017-03-29 DIAGNOSIS — B9789 Other viral agents as the cause of diseases classified elsewhere: Secondary | ICD-10-CM | POA: Diagnosis not present

## 2017-03-29 DIAGNOSIS — J069 Acute upper respiratory infection, unspecified: Secondary | ICD-10-CM | POA: Diagnosis not present

## 2017-03-29 DIAGNOSIS — H66001 Acute suppurative otitis media without spontaneous rupture of ear drum, right ear: Secondary | ICD-10-CM | POA: Diagnosis not present

## 2017-10-05 DIAGNOSIS — L219 Seborrheic dermatitis, unspecified: Secondary | ICD-10-CM | POA: Diagnosis not present

## 2017-10-05 DIAGNOSIS — L821 Other seborrheic keratosis: Secondary | ICD-10-CM | POA: Diagnosis not present

## 2019-03-01 ENCOUNTER — Telehealth: Payer: BLUE CROSS/BLUE SHIELD | Admitting: Family

## 2019-03-01 DIAGNOSIS — Z20822 Contact with and (suspected) exposure to covid-19: Secondary | ICD-10-CM

## 2019-03-01 DIAGNOSIS — Z20828 Contact with and (suspected) exposure to other viral communicable diseases: Secondary | ICD-10-CM | POA: Diagnosis not present

## 2019-03-01 MED ORDER — BENZONATATE 100 MG PO CAPS: 100.0000 mg | ORAL_CAPSULE | Freq: Three times a day (TID) | ORAL | 0 refills | Status: AC | PRN

## 2019-03-01 MED ORDER — ALBUTEROL SULFATE HFA 108 (90 BASE) MCG/ACT IN AERS: 2.0000 | INHALATION_SPRAY | RESPIRATORY_TRACT | 0 refills | Status: AC | PRN

## 2019-03-01 NOTE — Progress Notes (Signed)
Greater than 5 minutes, yet less than 10 minutes of time have been spent researching, coordinating, and implementing care for this patient today.  Thank you for the details you included in the comment boxes. Those details are very helpful in determining the best course of treatment for you and help us to provide the best care.  E-Visit for Corona Virus Screening   Your current symptoms could be consistent with the coronavirus.  Call your health care provider or local health department to request and arrange formal testing. Many health care providers can now test patients at their office but not all are.  Please quarantine yourself while awaiting your test results.  [facility] [phone], [facility] [phone], [facility] [phone] or visit [email or web address]  and You have been enrolled in MyChart Home Monitoring for COVID-19.  Daily you will receive a questionnaire within the MyChart website. Our COVID-19 response team will be monitoring your responses daily.    COVID-19 is a respiratory illness with symptoms that are similar to the flu. Symptoms are typically mild to moderate, but there have been cases of severe illness and death due to the virus. The following symptoms may appear 2-14 days after exposure: . Fever . Cough . Shortness of breath or difficulty breathing . Chills . Repeated shaking with chills . Muscle pain . Headache . Sore throat . New loss of taste or smell . Fatigue . Congestion or runny nose . Nausea or vomiting . Diarrhea  It is vitally important that if you feel that you have an infection such as this virus or any other virus that you stay home and away from places where you may spread it to others.  You should self-quarantine for 14 days if you have symptoms that could potentially be coronavirus or have been in close contact a with a  person diagnosed with COVID-19 within the last 2 weeks. You should avoid contact with people age 65 and older.   You should wear a mask or cloth face covering over your nose and mouth if you must be around other people or animals, including pets (even at home). Try to stay at least 6 feet away from other people. This will protect the people around you.  You can use medication such as A prescription cough medication called Tessalon Perles 100 mg. You may take 1-2 capsules every 8 hours as needed for cough and A prescription inhaler called Albuterol MDI 90 mcg /actuation 2 puffs every 4 hours as needed for shortness of breath, wheezing, cough  You may also take acetaminophen (Tylenol) as needed for fever.   Reduce your risk of any infection by using the same precautions used for avoiding the common cold or flu:  . Wash your hands often with soap and warm water for at least 20 seconds.  If soap and water are not readily available, use an alcohol-based hand sanitizer with at least 60% alcohol.  . If coughing or sneezing, cover your mouth and nose by coughing or sneezing into the elbow areas of your shirt or coat, into a tissue or into your sleeve (not your hands). . Avoid shaking hands with others and consider head nods or verbal greetings only. . Avoid touching your eyes, nose, or mouth with unwashed hands.  . Avoid close contact with people who are sick. . Avoid places or events with large numbers of people in one location, like concerts or sporting events. . Carefully consider travel plans you have or   are making. . If you are planning any travel outside or inside the [place], visit the CDC's Travelers' Health webpage for the latest health notices. . If you have some symptoms but not all symptoms, continue to monitor at home and seek medical attention if your symptoms worsen. . If you are having a medical emergency, call 911.  HOME CARE . Only take medications as instructed by your medical  team. . Drink plenty of fluids and get plenty of rest. . A steam or ultrasonic humidifier can help if you have congestion.   GET HELP RIGHT AWAY IF YOU HAVE EMERGENCY WARNING SIGNS** FOR COVID-19. If you or someone is showing any of these signs seek emergency medical care immediately. Call 911 or proceed to your closest emergency facility if: . You develop worsening high fever. . Trouble breathing . Bluish lips or face . Persistent pain or pressure in the chest . New confusion . Inability to wake or stay awake . You cough up blood. . Your symptoms become more severe  **This list is not all possible symptoms. Contact your medical provider for any symptoms that are sever or concerning to you.   MAKE SURE YOU   Understand these instructions.  Will watch your condition.  Will get help right away if you are not doing well or get worse.  Your e-visit answers were reviewed by a board certified advanced clinical practitioner to complete your personal care plan.  Depending on the condition, your plan could have included both over the counter or prescription medications.  If there is a problem please reply once you have received a response from your provider.  Your safety is important to us.  If you have drug allergies check your prescription carefully.    You can use MyChart to ask questions about today's visit, request a non-urgent call back, or ask for a work or school excuse for 24 hours related to this e-Visit. If it has been greater than 24 hours you will need to follow up with your provider, or enter a new e-Visit to address those concerns. You will get an e-mail in the next two days asking about your experience.  I hope that your e-visit has been valuable and will speed your recovery. Thank you for using e-visits.    

## 2019-03-02 DIAGNOSIS — Z20828 Contact with and (suspected) exposure to other viral communicable diseases: Secondary | ICD-10-CM | POA: Diagnosis not present

## 2019-05-10 DIAGNOSIS — H524 Presbyopia: Secondary | ICD-10-CM | POA: Diagnosis not present

## 2019-05-10 DIAGNOSIS — H5203 Hypermetropia, bilateral: Secondary | ICD-10-CM | POA: Diagnosis not present

## 2019-05-10 DIAGNOSIS — Z135 Encounter for screening for eye and ear disorders: Secondary | ICD-10-CM | POA: Diagnosis not present

## 2020-06-23 DIAGNOSIS — M545 Low back pain, unspecified: Secondary | ICD-10-CM | POA: Diagnosis not present

## 2020-06-30 DIAGNOSIS — M545 Low back pain, unspecified: Secondary | ICD-10-CM | POA: Diagnosis not present

## 2020-09-24 DIAGNOSIS — E7800 Pure hypercholesterolemia, unspecified: Secondary | ICD-10-CM | POA: Diagnosis not present

## 2020-09-24 DIAGNOSIS — Z23 Encounter for immunization: Secondary | ICD-10-CM | POA: Diagnosis not present

## 2020-09-24 DIAGNOSIS — Z0000 Encounter for general adult medical examination without abnormal findings: Secondary | ICD-10-CM | POA: Diagnosis not present

## 2020-09-24 DIAGNOSIS — Z125 Encounter for screening for malignant neoplasm of prostate: Secondary | ICD-10-CM | POA: Diagnosis not present

## 2021-02-11 DIAGNOSIS — K921 Melena: Secondary | ICD-10-CM | POA: Diagnosis not present

## 2021-02-22 DIAGNOSIS — H9390 Unspecified disorder of ear, unspecified ear: Secondary | ICD-10-CM | POA: Diagnosis not present

## 2021-02-22 DIAGNOSIS — H60331 Swimmer's ear, right ear: Secondary | ICD-10-CM | POA: Diagnosis not present

## 2021-08-30 DIAGNOSIS — L738 Other specified follicular disorders: Secondary | ICD-10-CM | POA: Diagnosis not present

## 2021-08-30 DIAGNOSIS — D485 Neoplasm of uncertain behavior of skin: Secondary | ICD-10-CM | POA: Diagnosis not present

## 2021-08-30 DIAGNOSIS — L821 Other seborrheic keratosis: Secondary | ICD-10-CM | POA: Diagnosis not present

## 2021-11-09 DIAGNOSIS — Z125 Encounter for screening for malignant neoplasm of prostate: Secondary | ICD-10-CM | POA: Diagnosis not present

## 2021-11-09 DIAGNOSIS — Z23 Encounter for immunization: Secondary | ICD-10-CM | POA: Diagnosis not present

## 2021-11-09 DIAGNOSIS — Z0000 Encounter for general adult medical examination without abnormal findings: Secondary | ICD-10-CM | POA: Diagnosis not present

## 2021-11-09 DIAGNOSIS — E7800 Pure hypercholesterolemia, unspecified: Secondary | ICD-10-CM | POA: Diagnosis not present

## 2022-01-07 DIAGNOSIS — K625 Hemorrhage of anus and rectum: Secondary | ICD-10-CM | POA: Diagnosis not present

## 2022-01-07 DIAGNOSIS — K649 Unspecified hemorrhoids: Secondary | ICD-10-CM | POA: Diagnosis not present

## 2022-01-07 DIAGNOSIS — Z1211 Encounter for screening for malignant neoplasm of colon: Secondary | ICD-10-CM | POA: Diagnosis not present

## 2022-01-17 DIAGNOSIS — H10021 Other mucopurulent conjunctivitis, right eye: Secondary | ICD-10-CM | POA: Diagnosis not present

## 2022-03-31 DIAGNOSIS — D120 Benign neoplasm of cecum: Secondary | ICD-10-CM | POA: Diagnosis not present

## 2022-03-31 DIAGNOSIS — D124 Benign neoplasm of descending colon: Secondary | ICD-10-CM | POA: Diagnosis not present

## 2022-03-31 DIAGNOSIS — Z1211 Encounter for screening for malignant neoplasm of colon: Secondary | ICD-10-CM | POA: Diagnosis not present

## 2022-03-31 DIAGNOSIS — D122 Benign neoplasm of ascending colon: Secondary | ICD-10-CM | POA: Diagnosis not present

## 2022-03-31 DIAGNOSIS — D123 Benign neoplasm of transverse colon: Secondary | ICD-10-CM | POA: Diagnosis not present

## 2022-03-31 DIAGNOSIS — D125 Benign neoplasm of sigmoid colon: Secondary | ICD-10-CM | POA: Diagnosis not present

## 2022-03-31 DIAGNOSIS — D126 Benign neoplasm of colon, unspecified: Secondary | ICD-10-CM | POA: Diagnosis not present

## 2022-04-21 DIAGNOSIS — K635 Polyp of colon: Secondary | ICD-10-CM | POA: Diagnosis not present

## 2022-04-21 DIAGNOSIS — Z86010 Personal history of colonic polyps: Secondary | ICD-10-CM | POA: Diagnosis not present

## 2022-04-21 DIAGNOSIS — Z09 Encounter for follow-up examination after completed treatment for conditions other than malignant neoplasm: Secondary | ICD-10-CM | POA: Diagnosis not present

## 2022-04-21 DIAGNOSIS — K6389 Other specified diseases of intestine: Secondary | ICD-10-CM | POA: Diagnosis not present

## 2022-04-21 DIAGNOSIS — K648 Other hemorrhoids: Secondary | ICD-10-CM | POA: Diagnosis not present

## 2022-11-23 DIAGNOSIS — G250 Essential tremor: Secondary | ICD-10-CM | POA: Diagnosis not present

## 2022-11-23 DIAGNOSIS — Z0000 Encounter for general adult medical examination without abnormal findings: Secondary | ICD-10-CM | POA: Diagnosis not present

## 2022-11-23 DIAGNOSIS — Z125 Encounter for screening for malignant neoplasm of prostate: Secondary | ICD-10-CM | POA: Diagnosis not present

## 2022-11-23 DIAGNOSIS — E7800 Pure hypercholesterolemia, unspecified: Secondary | ICD-10-CM | POA: Diagnosis not present

## 2023-03-24 DIAGNOSIS — M5442 Lumbago with sciatica, left side: Secondary | ICD-10-CM | POA: Diagnosis not present

## 2023-12-04 DIAGNOSIS — Z0000 Encounter for general adult medical examination without abnormal findings: Secondary | ICD-10-CM | POA: Diagnosis not present

## 2023-12-04 DIAGNOSIS — G250 Essential tremor: Secondary | ICD-10-CM | POA: Diagnosis not present

## 2023-12-04 DIAGNOSIS — L309 Dermatitis, unspecified: Secondary | ICD-10-CM | POA: Diagnosis not present

## 2023-12-04 DIAGNOSIS — R6882 Decreased libido: Secondary | ICD-10-CM | POA: Diagnosis not present

## 2023-12-04 DIAGNOSIS — E7800 Pure hypercholesterolemia, unspecified: Secondary | ICD-10-CM | POA: Diagnosis not present

## 2023-12-04 DIAGNOSIS — Z125 Encounter for screening for malignant neoplasm of prostate: Secondary | ICD-10-CM | POA: Diagnosis not present

## 2023-12-15 DIAGNOSIS — E291 Testicular hypofunction: Secondary | ICD-10-CM | POA: Diagnosis not present

## 2024-05-06 DIAGNOSIS — Z0100 Encounter for examination of eyes and vision without abnormal findings: Secondary | ICD-10-CM | POA: Diagnosis not present
